# Patient Record
Sex: Male | Born: 2000 | Race: White | Hispanic: No | Marital: Single | Smoking: Never smoker
Health system: Southern US, Community
[De-identification: ages and names within clinical notes are randomized; demographics above are authoritative.]

## PROBLEM LIST (undated history)

## (undated) DIAGNOSIS — J45909 Unspecified asthma, uncomplicated: Secondary | ICD-10-CM

## (undated) DIAGNOSIS — F909 Attention-deficit hyperactivity disorder, unspecified type: Secondary | ICD-10-CM

---

## 2019-10-04 ENCOUNTER — Emergency Department (HOSPITAL_COMMUNITY)
Admission: EM | Admit: 2019-10-04 | Discharge: 2019-10-05 | Disposition: A | Discharge status: Home / Self Care | Payer: 59 | Attending: Emergency Medicine | Admitting: Emergency Medicine

## 2019-10-04 ENCOUNTER — Other Ambulatory Visit: Payer: Self-pay

## 2019-10-04 ENCOUNTER — Encounter (HOSPITAL_COMMUNITY): Payer: Self-pay | Admitting: Emergency Medicine

## 2019-10-04 ENCOUNTER — Emergency Department (HOSPITAL_COMMUNITY): Payer: 59

## 2019-10-04 DIAGNOSIS — R0789 Other chest pain: Secondary | ICD-10-CM | POA: Diagnosis not present

## 2019-10-04 DIAGNOSIS — R0602 Shortness of breath: Secondary | ICD-10-CM | POA: Insufficient documentation

## 2019-10-04 DIAGNOSIS — J45909 Unspecified asthma, uncomplicated: Secondary | ICD-10-CM | POA: Diagnosis not present

## 2019-10-04 HISTORY — DX: Unspecified asthma, uncomplicated: J45.909

## 2019-10-04 LAB — CBC
HCT: 45.7 % (ref 39.0–52.0)
Hemoglobin: 15.9 g/dL (ref 13.0–17.0)
MCH: 31.1 pg (ref 26.0–34.0)
MCHC: 34.8 g/dL (ref 30.0–36.0)
MCV: 89.4 fL (ref 80.0–100.0)
Platelets: 271 10*3/uL (ref 150–400)
RBC: 5.11 MIL/uL (ref 4.22–5.81)
RDW: 12.7 % (ref 11.5–15.5)
WBC: 10.4 10*3/uL (ref 4.0–10.5)
nRBC: 0 % (ref 0.0–0.2)

## 2019-10-04 LAB — BASIC METABOLIC PANEL
Anion gap: 12 (ref 5–15)
BUN: 10 mg/dL (ref 6–20)
CO2: 23 mmol/L (ref 22–32)
Calcium: 9.4 mg/dL (ref 8.9–10.3)
Chloride: 101 mmol/L (ref 98–111)
Creatinine, Ser: 0.98 mg/dL (ref 0.61–1.24)
GFR calc Af Amer: >60 mL/min (ref >60)
GFR calc non Af Amer: >60 mL/min (ref >60)
Glucose, Bld: 143 mg/dL — ABNORMAL HIGH (ref 70–99)
Potassium: 3.1 mmol/L — ABNORMAL LOW (ref 3.5–5.1)
Sodium: 136 mmol/L (ref 135–145)

## 2019-10-04 LAB — D-DIMER, QUANTITATIVE: D-Dimer, Quant: 0.28 ug/mL-FEU (ref 0.00–0.50)

## 2019-10-04 LAB — TROPONIN I (HIGH SENSITIVITY): Troponin I (High Sensitivity): <2 ng/L (ref <18)

## 2019-10-04 MED ORDER — HYDROCODONE-ACETAMINOPHEN 5-325 MG PO TABS: 1.0000 | ORAL_TABLET | Freq: Once | ORAL | Status: DC

## 2019-10-04 MED ORDER — SODIUM CHLORIDE 0.9% FLUSH
3.0000 mL | Freq: Once | INTRAVENOUS | Status: AC
  Administered 2019-10-04: 3 mL via INTRAVENOUS

## 2019-10-04 MED ORDER — POTASSIUM CHLORIDE CRYS ER 20 MEQ PO TBCR
40.0000 meq | EXTENDED_RELEASE_TABLET | Freq: Once | ORAL | Status: AC
  Administered 2019-10-04: 40 meq via ORAL
  Filled 2019-10-04: qty 2

## 2019-10-04 NOTE — ED Provider Notes (Signed)
[facility] EMERGENCY DEPARTMENT Provider Note   CSN: [id number] Arrival date & time: 10/04/19  2112     History Chief Complaint  Patient presents with  . Shortness of Breath  . Chest Pain    Larry Henderson is a 19 y.o. male.  Patient presents to the emergency department with a chief complaint of shortness of breath and chest pain.  He reports gradually worsening symptoms over the past week, but acutely worsened today.  He does have a history of asthma, and states that asthma got worse when he had Covid-19 in September he had recovered completely from this.  He reports family history of blood clots.  Reports that he recently had a 12-hour drive about 2 weeks ago.  Denies any calf pain or leg swelling.  He states that hurts more to take a deep breath, and he feels out of breath.  He also reports anterior chest wall soreness, but denies any injury.  Denies any fever, chills, cough.  Denies any treatments prior to arrival.  The history is provided by the patient. No language interpreter was used.       Past Medical History:  Diagnosis Date  . Asthma     There are no problems to display for this patient.   History reviewed. No pertinent surgical history.     No family history on file.  Social History   Tobacco Use  . Smoking status: Never Smoker  . Smokeless tobacco: Never Used  Substance Use Topics  . Alcohol use: Never  . Drug use: Never    Home Medications Prior to Admission medications   Not on File    Allergies    Patient has no known allergies.  Review of Systems   Review of Systems  All other systems reviewed and are negative.   Physical Exam Updated Vital Signs BP 124/90 (BP Location: Left Arm)   Pulse 80   Temp 98.4 F (36.9 C) (Oral)   Resp 18   SpO2 100%   Physical Exam Vitals and nursing note reviewed.  Constitutional:      Appearance: He is well-developed.  HENT:     Head: Normocephalic and atraumatic.  Eyes:      Conjunctiva/sclera: Conjunctivae normal.  Cardiovascular:     Rate and Rhythm: Normal rate and regular rhythm.     Heart sounds: No murmur.  Pulmonary:     Effort: Pulmonary effort is normal. No respiratory distress.     Breath sounds: Normal breath sounds.     Comments: Lungs are clear to auscultation bilaterally Moderate anterior chest wall tenderness Abdominal:     Palpations: Abdomen is soft.     Tenderness: There is no abdominal tenderness.  Musculoskeletal:     Cervical back: Neck supple.     Comments: No calf tenderness No leg swelling  Skin:    General: Skin is warm and dry.  Neurological:     Mental Status: He is alert and oriented to person, place, and time.  Psychiatric:        Mood and Affect: Mood normal.        Behavior: Behavior normal.     ED Results / Procedures / Treatments   Labs (all labs ordered are listed, but only abnormal results are displayed) Labs Reviewed  BASIC METABOLIC PANEL - Abnormal; Notable for the following components:      Result Value   Potassium 3.1 (*)    Glucose, Bld 143 (*)    All  other components within normal limits  CBC  D-DIMER, QUANTITATIVE (NOT AT [facility])  TROPONIN I (HIGH SENSITIVITY)    EKG None  Radiology DG Chest 2 View  Result Date: 10/04/2019 CLINICAL DATA:  Chest pain. EXAM: CHEST - 2 VIEW COMPARISON:  None. FINDINGS: There is no evidence of acute infiltrate, pleural effusion or pneumothorax. A tiny area of atelectasis versus focal scarring is seen within the mid right lung. The heart size and mediastinal contours are within normal limits. The visualized skeletal structures are unremarkable. IMPRESSION: No active cardiopulmonary disease. Electronically Signed   By: Aram Candela M.D.   On: 10/04/2019 21:49    Procedures Procedures (including critical care time)  Medications Ordered in ED Medications  sodium chloride flush (NS) 0.9 % injection 3 mL (has no administration in time range)  potassium  chloride SA (KLOR-CON) CR tablet 40 mEq (has no administration in time range)  HYDROcodone-acetaminophen (NORCO/VICODIN) 5-325 MG per tablet 1 tablet (has no administration in time range)    ED Course  I have reviewed the triage vital signs and the nursing notes.  Pertinent labs & imaging results that were available during my care of the patient were reviewed by me and considered in my medical decision making (see chart for details).    MDM Rules/Calculators/A&P                      Patient with chest pain and shortness of breath.  Vital signs are normal.  He is not hypoxic nor tachycardic.  No fever or cough.  Had Covid-19 back in September.  Chest x-ray is negative.  Laboratory work-up notable for hypokalemia of 3.1.  Will give supplemental K here.  Encouraged to increase dietary K.  Patient does report family history of blood clots, and did have a recent long car ride (12 hours).  Will check D-dimer.  Anticipate discharge.  D dimer is negative, no hypoxia nor tachycardia.  Doubt PE.  Low risk for ACS given age and lack of risk factors.  Easily reproducible with palpation.  Likely MSK.  Recommend continue outpatient workup.  No emergent etiology identified.  Final Clinical Impression(s) / ED Diagnoses Final diagnoses:  Shortness of breath  Chest wall pain    Rx / DC Orders ED Discharge Orders         Ordered    ibuprofen (ADVIL) 800 MG tablet  3 times daily     10/05/19 0000    albuterol (VENTOLIN HFA) 108 (90 Base) MCG/ACT inhaler  Every 4 hours PRN     10/05/19 0000           Roxy Horseman, PA-C 10/05/19 2542    Rolan Bucco, MD 10/06/19 1124

## 2019-10-04 NOTE — ED Triage Notes (Signed)
Pt c/o chest pain and shortness of breath x 1 week. Pt hx asthma. States he has struggled with shortness of breath since being diagnosed with COVID in September.

## 2019-10-04 NOTE — ED Notes (Addendum)
Pt ambulatory to Rm 3 from WR. Pt is A&Ox4, in NAD. VSS on monitors. Pt c/o sharp central CP and SOB x1 week. Pt reports it worsened today. Pt ambulatory in room w/o difficulty. Speaking in full sentences.

## 2019-10-04 NOTE — ED Notes (Signed)
PIV initiated, 20 G to RAC. IV flushes with 10 cc NS without s/s of infiltration. Secured with tape and tegaderm. Positive blood return noted. D Dimer drawn, labeled with 2 pt identifiers, and sent to lab

## 2019-10-04 NOTE — ED Notes (Signed)
Repeat troponin drawn, labeled with 2 pt identifiers, and sent to lab Potassium given per MAR. Name/DOB verified with pt Pt remains on cart in NAD. VSS on monitors. Call light within reach. Breathing easy, non-labored

## 2019-10-04 NOTE — ED Notes (Signed)
ED Provider at bedside. 

## 2019-10-04 NOTE — ED Notes (Signed)
Ahmaud Duthie mom [phone] aware pt just arrived, call with any questions/ updates

## 2019-10-05 LAB — TROPONIN I (HIGH SENSITIVITY): Troponin I (High Sensitivity): <2 ng/L (ref <18)

## 2019-10-05 MED ORDER — IBUPROFEN 800 MG PO TABS
800.0000 mg | ORAL_TABLET | Freq: Once | ORAL | Status: AC
  Administered 2019-10-05: 01:00:00 800 mg via ORAL
  Filled 2019-10-05: qty 1

## 2019-10-05 MED ORDER — ALBUTEROL SULFATE HFA 108 (90 BASE) MCG/ACT IN AERS: 2.0000 | INHALATION_SPRAY | RESPIRATORY_TRACT | 0 refills | Status: AC | PRN

## 2019-10-05 MED ORDER — IBUPROFEN 800 MG PO TABS: 800.0000 mg | ORAL_TABLET | Freq: Three times a day (TID) | ORAL | 0 refills | Status: AC

## 2019-10-05 MED ORDER — ALBUTEROL SULFATE HFA 108 (90 BASE) MCG/ACT IN AERS
1.0000 | INHALATION_SPRAY | RESPIRATORY_TRACT | Status: DC | PRN
  Administered 2019-10-05: 01:00:00 2 via RESPIRATORY_TRACT
  Filled 2019-10-05: qty 6.7

## 2019-10-05 NOTE — ED Notes (Signed)
Pt provided with phone to give family update

## 2019-10-05 NOTE — ED Notes (Signed)
All meds given per MAR. Name/DOB verified with pt 

## 2019-10-05 NOTE — Discharge Instructions (Signed)
You were evaluated for chest pain and shortness of breath tonight.  No emergent causes of your symptoms was found.    You workup today puts you at extremely low risk for pulmonary embolism (blood clot), heart attack, or infection.  Because your pain can be reproduced with pressing on your chest, it is thought to be musculoskeletal in nature, however, I recommend that you be seen by your primary care doctor in follow-up.    Return to the ER for new or worsening symptoms.

## 2019-10-05 NOTE — ED Notes (Addendum)
Patient verbalizes understanding of discharge instructions. Follow up and prescription medication information provided. Opportunity for questioning and answers were provided. All questions answered Armband removed by staff, pt discharged from ED. Ambulatory from ED with strong, steady gait.

## 2019-10-05 NOTE — ED Notes (Signed)
ED Provider at bedside. 

## 2019-10-05 NOTE — ED Notes (Signed)
Pt requesting dose of albuterol and ibuprofen before he leaves because he does not have a ride to a 24 hr pharmacy. PA updated

## 2019-10-16 ENCOUNTER — Other Ambulatory Visit: Payer: Self-pay

## 2019-10-16 ENCOUNTER — Emergency Department (HOSPITAL_BASED_OUTPATIENT_CLINIC_OR_DEPARTMENT_OTHER): Payer: 59

## 2019-10-16 ENCOUNTER — Encounter (HOSPITAL_BASED_OUTPATIENT_CLINIC_OR_DEPARTMENT_OTHER): Payer: Self-pay | Admitting: Emergency Medicine

## 2019-10-16 ENCOUNTER — Emergency Department (HOSPITAL_BASED_OUTPATIENT_CLINIC_OR_DEPARTMENT_OTHER)
Admission: EM | Admit: 2019-10-16 | Discharge: 2019-10-16 | Disposition: A | Discharge status: Home / Self Care | Payer: 59 | Attending: Emergency Medicine | Admitting: Emergency Medicine

## 2019-10-16 DIAGNOSIS — S91311A Laceration without foreign body, right foot, initial encounter: Secondary | ICD-10-CM | POA: Insufficient documentation

## 2019-10-16 DIAGNOSIS — J45909 Unspecified asthma, uncomplicated: Secondary | ICD-10-CM | POA: Diagnosis not present

## 2019-10-16 DIAGNOSIS — Y929 Unspecified place or not applicable: Secondary | ICD-10-CM | POA: Diagnosis not present

## 2019-10-16 DIAGNOSIS — Y939 Activity, unspecified: Secondary | ICD-10-CM | POA: Insufficient documentation

## 2019-10-16 DIAGNOSIS — Y999 Unspecified external cause status: Secondary | ICD-10-CM | POA: Insufficient documentation

## 2019-10-16 DIAGNOSIS — Z79899 Other long term (current) drug therapy: Secondary | ICD-10-CM | POA: Insufficient documentation

## 2019-10-16 DIAGNOSIS — W25XXXA Contact with sharp glass, initial encounter: Secondary | ICD-10-CM | POA: Insufficient documentation

## 2019-10-16 DIAGNOSIS — Z23 Encounter for immunization: Secondary | ICD-10-CM | POA: Insufficient documentation

## 2019-10-16 DIAGNOSIS — F909 Attention-deficit hyperactivity disorder, unspecified type: Secondary | ICD-10-CM | POA: Insufficient documentation

## 2019-10-16 HISTORY — DX: Attention-deficit hyperactivity disorder, unspecified type: F90.9

## 2019-10-16 MED ORDER — CEPHALEXIN 500 MG PO CAPS: 500.0000 mg | ORAL_CAPSULE | Freq: Four times a day (QID) | ORAL | 0 refills | Status: AC

## 2019-10-16 MED ORDER — LIDOCAINE-EPINEPHRINE (PF) 2 %-1:200000 IJ SOLN
10.0000 mL | Freq: Once | INTRAMUSCULAR | Status: AC
  Administered 2019-10-16: 10 mL
  Filled 2019-10-16: qty 10

## 2019-10-16 MED ORDER — CEPHALEXIN 250 MG PO CAPS
500.0000 mg | ORAL_CAPSULE | Freq: Once | ORAL | Status: AC
  Administered 2019-10-16: 500 mg via ORAL
  Filled 2019-10-16: qty 2

## 2019-10-16 MED ORDER — TETANUS-DIPHTH-ACELL PERTUSSIS 5-2.5-18.5 LF-MCG/0.5 IM SUSP
0.5000 mL | Freq: Once | INTRAMUSCULAR | Status: AC
  Administered 2019-10-16: 0.5 mL via INTRAMUSCULAR
  Filled 2019-10-16: qty 0.5

## 2019-10-16 NOTE — ED Triage Notes (Signed)
Pt reports cutting right foot on glass last night. Bandage applied pta. No bleeding apparent

## 2019-10-16 NOTE — ED Notes (Signed)
States he stepped on broken glass from a broken mirror, laceration at base of rt foot, no active bleeding noted, approx 3/4cm in length, some mild swelling and mild bruising noted.

## 2019-10-16 NOTE — ED Notes (Signed)
Pt states that it feels like glass still in foot.

## 2019-10-16 NOTE — ED Notes (Signed)
Wound dressed prior to discharge.

## 2019-10-16 NOTE — Discharge Instructions (Signed)
Please read and follow all provided instructions.  Your diagnoses today include:  1. Laceration of right foot, initial encounter     Tests performed today include:  X-ray of the affected area that did not show any foreign bodies or broken bones  Vital signs. See below for your results today.   Medications prescribed:   Keflex (cephalexin) - antibiotic  You have been prescribed an antibiotic medicine: take the entire course of medicine even if you are feeling better. Stopping early can cause the antibiotic not to work.  Take any prescribed medications only as directed.   Home care instructions:  Follow any educational materials and wound care instructions contained in this packet.   Keep affected area above the level of your heart when possible to minimize swelling. Wash area gently twice a day with warm soapy water. Do not apply alcohol or hydrogen peroxide. Cover the area if it draining or weeping.   Return instructions:  Return to the Emergency Department if you have:  Fever  Worsening pain  Worsening swelling of the wound  Pus draining from the wound  Redness of the skin that moves away from the wound, especially if it streaks away from the affected area   Any other emergent concerns  Your vital signs today were: BP 136/87 (BP Location: Right Arm)   Pulse 97   Temp 99.5 F (37.5 C) (Oral)   Resp 20   Ht 5\' 8"  (1.727 m)   Wt 59 kg   SpO2 100%   BMI 19.77 kg/m  If your blood pressure (BP) was elevated above 135/85 this visit, please have this repeated by your doctor within one month. --------------

## 2019-10-16 NOTE — ED Provider Notes (Signed)
[facility] EMERGENCY DEPARTMENT Provider Note   CSN: [id number] Arrival date & time: 10/16/19  1807     History Chief Complaint  Patient presents with  . Laceration    Larry Henderson is a 19 y.o. male.  Patient presents to the emergency department today with complaint of laceration to the bottom of his right foot.  Injury was sustained at approximately 1 or 2 AM today when he stepped on some glass from a broken mirror.  He states that he was wearing a sock when this occurred.  He cleaned the area.  He has had pain with bearing weight.  He felt a hard area near the wound which he was concerned was a retained foreign body.  Patient denies any active bleeding.  He denies any history of diabetes or immune compromise.  No treatments otherwise prior to arrival.  Last tetanus at age 56.        Past Medical History:  Diagnosis Date  . ADHD   . Asthma     There are no problems to display for this patient.   History reviewed. No pertinent surgical history.     History reviewed. No pertinent family history.  Social History   Tobacco Use  . Smoking status: Never Smoker  . Smokeless tobacco: Never Used  Substance Use Topics  . Alcohol use: Not Currently  . Drug use: Never    Home Medications Prior to Admission medications   Medication Sig Start Date End Date Taking? Authorizing Provider  amphetamine-dextroamphetamine (ADDERALL) 10 MG tablet Take by mouth. 04/22/19  Yes [provider]  lisdexamfetamine (VYVANSE) 20 MG capsule Take by mouth. 04/22/19  Yes [provider]  albuterol (VENTOLIN HFA) 108 (90 Base) MCG/ACT inhaler Inhale 2 puffs into the lungs every 4 (four) hours as needed for wheezing or shortness of breath. 10/04/19   Roxy Horseman, PA-C  cephALEXin (KEFLEX) 500 MG capsule Take 1 capsule (500 mg total) by mouth 4 (four) times daily. 10/16/19   Renne Crigler, PA-C  ibuprofen (ADVIL) 800 MG tablet Take 1 tablet (800 mg total) by  mouth 3 (three) times daily. 10/04/19   Roxy Horseman, PA-C    Allergies    Patient has no known allergies.  Review of Systems   Review of Systems  Constitutional: Negative for activity change.  Musculoskeletal: Positive for arthralgias and gait problem (Due to pain with weightbearing).  Skin: Positive for wound.  Neurological: Negative for weakness and numbness.    Physical Exam Updated Vital Signs BP (!) 148/90 (BP Location: Right Arm)   Pulse 78   Temp 99.5 F (37.5 C) (Oral)   Resp 19   Ht 5\' 8"  (1.727 m)   Wt 59 kg   SpO2 100%   BMI 19.77 kg/m   Physical Exam Vitals and nursing note reviewed.  Constitutional:      Appearance: He is well-developed.  HENT:     Head: Normocephalic and atraumatic.  Eyes:     Conjunctiva/sclera: Conjunctivae normal.  Cardiovascular:     Pulses: Normal pulses. No decreased pulses.  Musculoskeletal:        General: Tenderness present. No swelling.     Cervical back: Normal range of motion and neck supple.     Comments: There is an approximately 8 mm V-shaped laceration noted to the plantar aspect of the right midfoot.  No active bleeding.  There is scabbing over the wound.  After anesthesia, wound was explored and probed.  I used hemostats and  while the wound was not deep, I did extend laterally underneath the subcutaneous tissue approximately 1 to 2 cm.  I did not palpate any hardness of the skin which would be consistent with deep foreign body and I did not feel any foreign bodies with thorough probing of the wound.  This was repeated after irrigation.  Skin:    General: Skin is warm and dry.  Neurological:     Mental Status: He is alert.     Sensory: No sensory deficit.     Comments: Motor, sensation, and vascular distal to the injury is fully intact.      ED Results / Procedures / Treatments   Labs (all labs ordered are listed, but only abnormal results are displayed) Labs Reviewed - No data to  display  EKG None  Radiology DG Foot Complete Right  Result Date: 10/16/2019 CLINICAL DATA:  Puncture wound to bottom of foot.  Stepped on glass. EXAM: RIGHT FOOT COMPLETE - 3+ VIEW COMPARISON:  None. FINDINGS: There is no evidence of fracture or dislocation. There is no evidence of arthropathy or other focal bone abnormality. Soft tissues are unremarkable. No radiopaque foreign body. IMPRESSION: Negative. Electronically Signed   By: Rolm Baptise M.D.   On: 10/16/2019 18:42    Procedures Procedures (including critical care time)  Medications Ordered in ED Medications  Tdap (BOOSTRIX) injection 0.5 mL (0.5 mLs Intramuscular Given 10/16/19 1857)  lidocaine-EPINEPHrine (XYLOCAINE W/EPI) 2 %-1:200000 (PF) injection 10 mL (10 mLs Infiltration Given 10/16/19 1856)  cephALEXin (KEFLEX) capsule 500 mg (500 mg Oral Given 10/16/19 2004)    ED Course  I have reviewed the triage vital signs and the nursing notes.  Pertinent labs & imaging results that were available during my care of the patient were reviewed by me and considered in my medical decision making (see chart for details).  Patient seen and examined.  Tetanus update ordered.  X-ray ordered to evaluate for radiopaque foreign body and this was negative.  X-ray reviewed by myself as well.  Vital signs reviewed and are as follows: BP (!) 148/90 (BP Location: Right Arm)   Pulse 78   Temp 99.5 F (37.5 C) (Oral)   Resp 19   Ht 5\' 8"  (1.727 m)   Wt 59 kg   SpO2 100%   BMI 19.77 kg/m   I discussed wound exploration with patient.  He agrees to proceed.  I used approximately 4 cc of 2% lidocaine with epinephrine and infiltrated around the wound.  Once adequate anesthesia was achieved, I visualized the base of the wound as best as possible.  The wound extended 1 to 2 cm into the subcutaneous tissue towards the anterior aspect of the foot.  This was completely along the subcutaneous layer.  Wound was not deep.  I could not visualize or palpate  any foreign bodies externally and I could not palpate or feel any foreign bodies using hemostats.  Exploration was extensive.  I then irrigated the wound with 1000 cc of normal saline under pressure.  I then again explored the wound with hemostats without any change in exam.  No foreign bodies retrieved.  I had a discussion with the patient about wound closure and this is not advised given it is greater than 12 hours after injury on a extremity.  Furthermore, we discussed that I cannot entirely rule out retained foreign body.  We will start patient on a short course of Keflex, first dose given in the ED.  We discussed signs  symptoms of infection and need to return.  We discussed wound care at home.  Encourage patient to return to the emergency department with worsening pain, swelling, redness, warmth, pus draining from the wound or other concerns.     MDM Rules/Calculators/A&P                      Patient with laceration on the plantar aspect of his right foot due to glass.  No indication for retained foreign body on x-ray or after thorough exploration of the wound.  Wound was left open given time since injury.  Treatment and return instructions as above.   Final Clinical Impression(s) / ED Diagnoses Final diagnoses:  Laceration of right foot, initial encounter    Rx / DC Orders ED Discharge Orders         Ordered    cephALEXin (KEFLEX) 500 MG capsule  4 times daily     10/16/19 1959           Renne Crigler, Cordelia Poche 10/16/19 2211    Rolan Bucco, MD 10/16/19 2223

## 2021-03-18 IMAGING — DX DG FOOT COMPLETE 3+V*R*
3 series · 3 of 3 positions shown · non-contrast
Comparison: None.

CLINICAL DATA: Puncture wound to bottom of foot.  Stepped on glass.

EXAM:
RIGHT FOOT COMPLETE - 3+ VIEW

[foot ap]
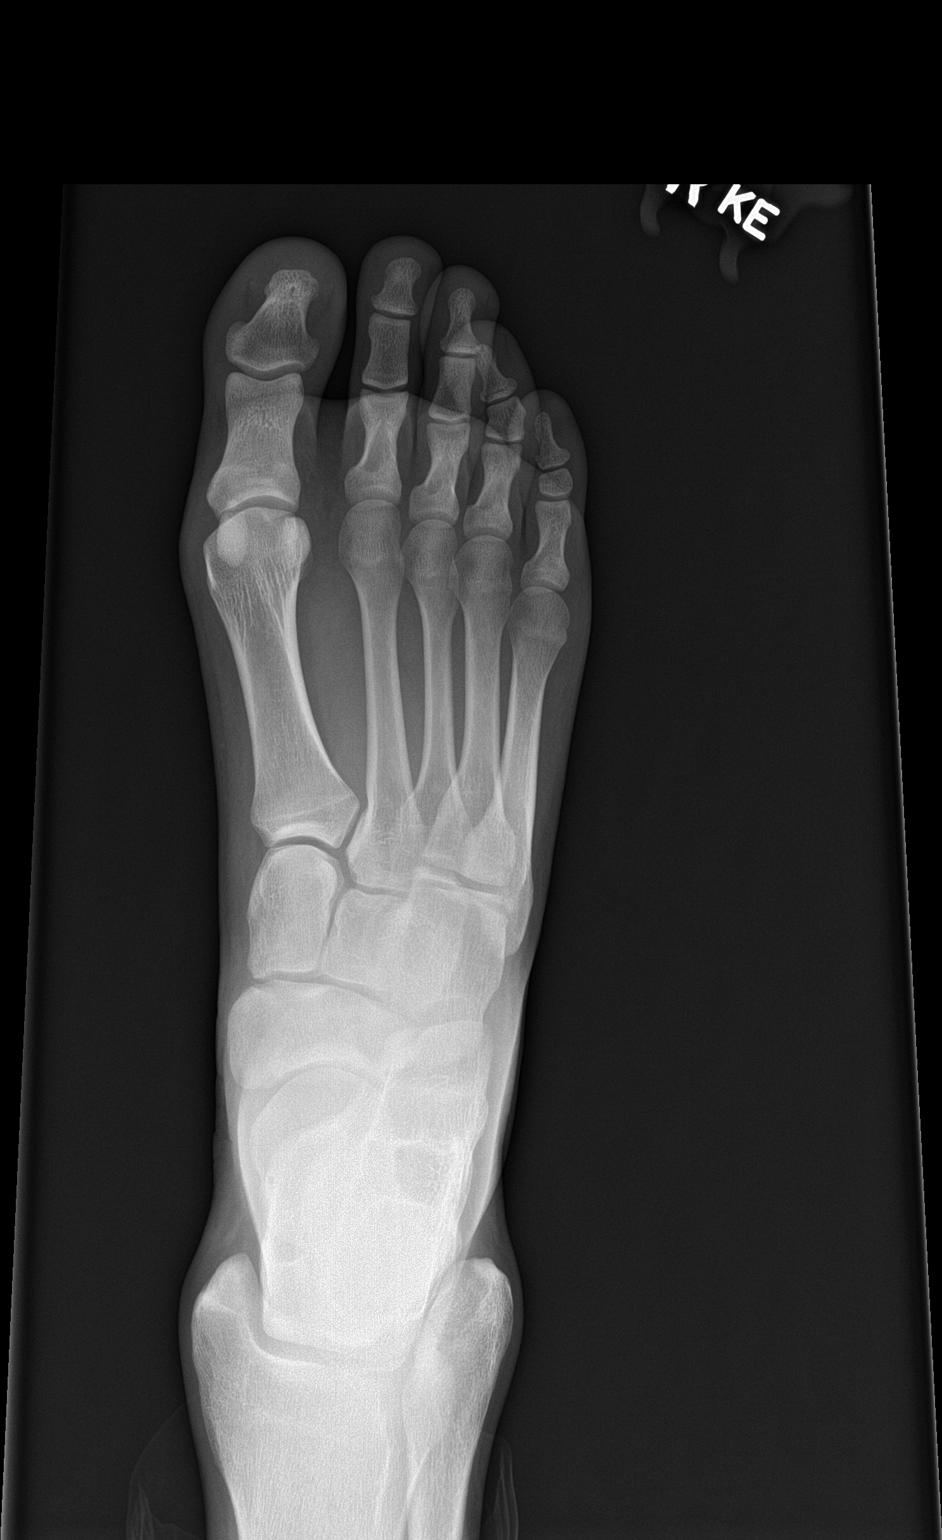

[foot obl]
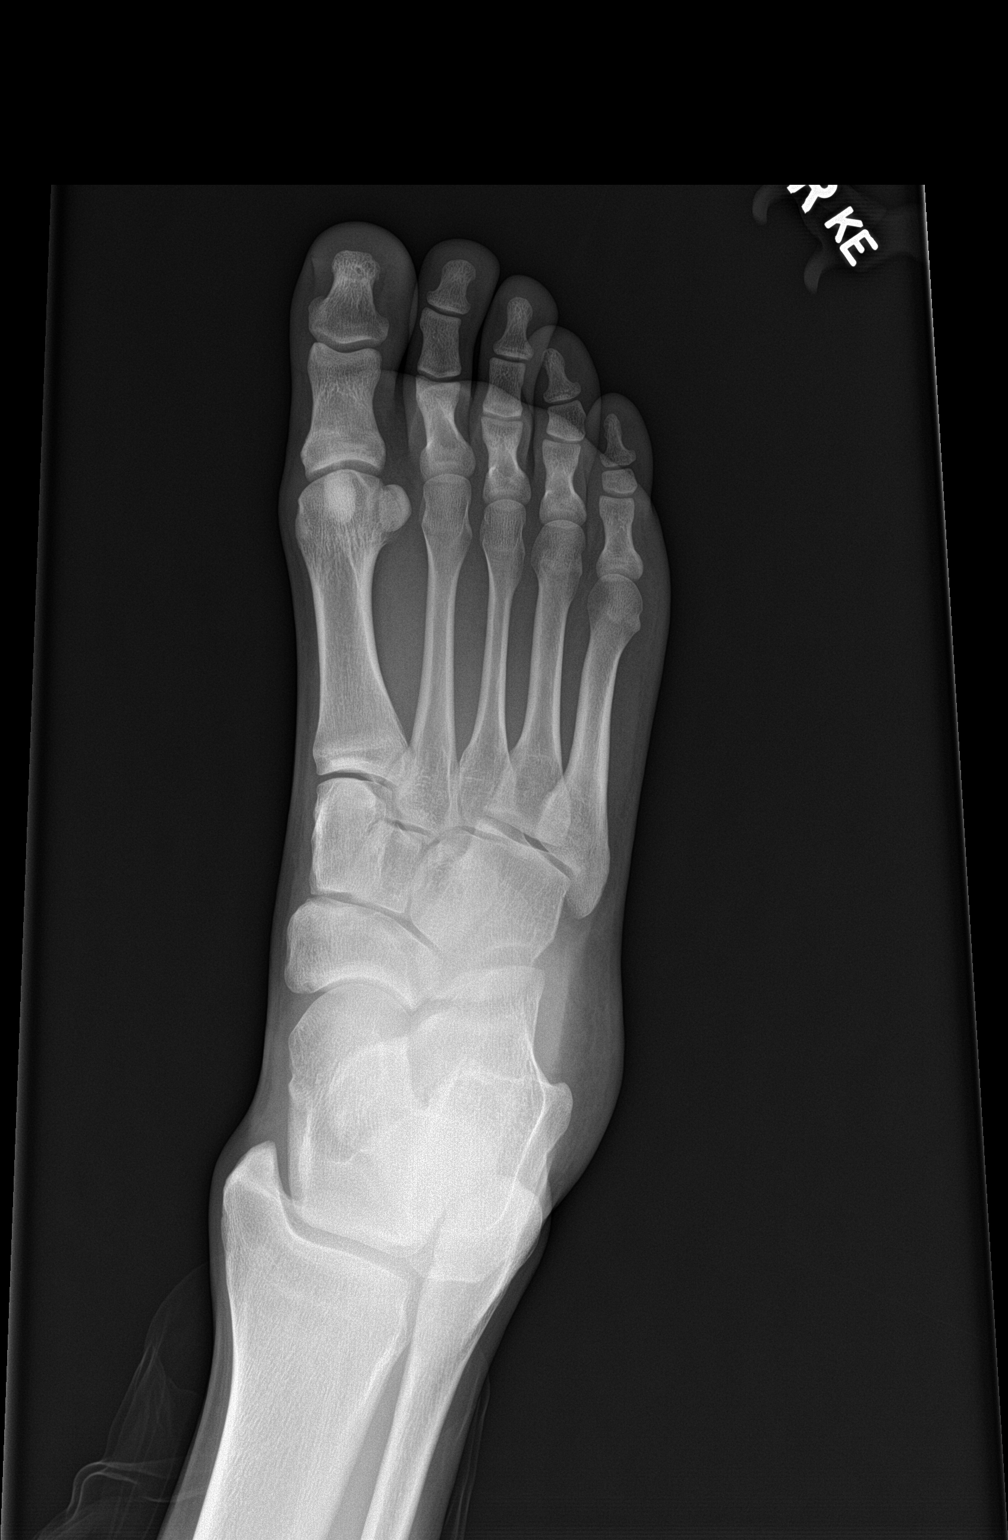

[foot lat]
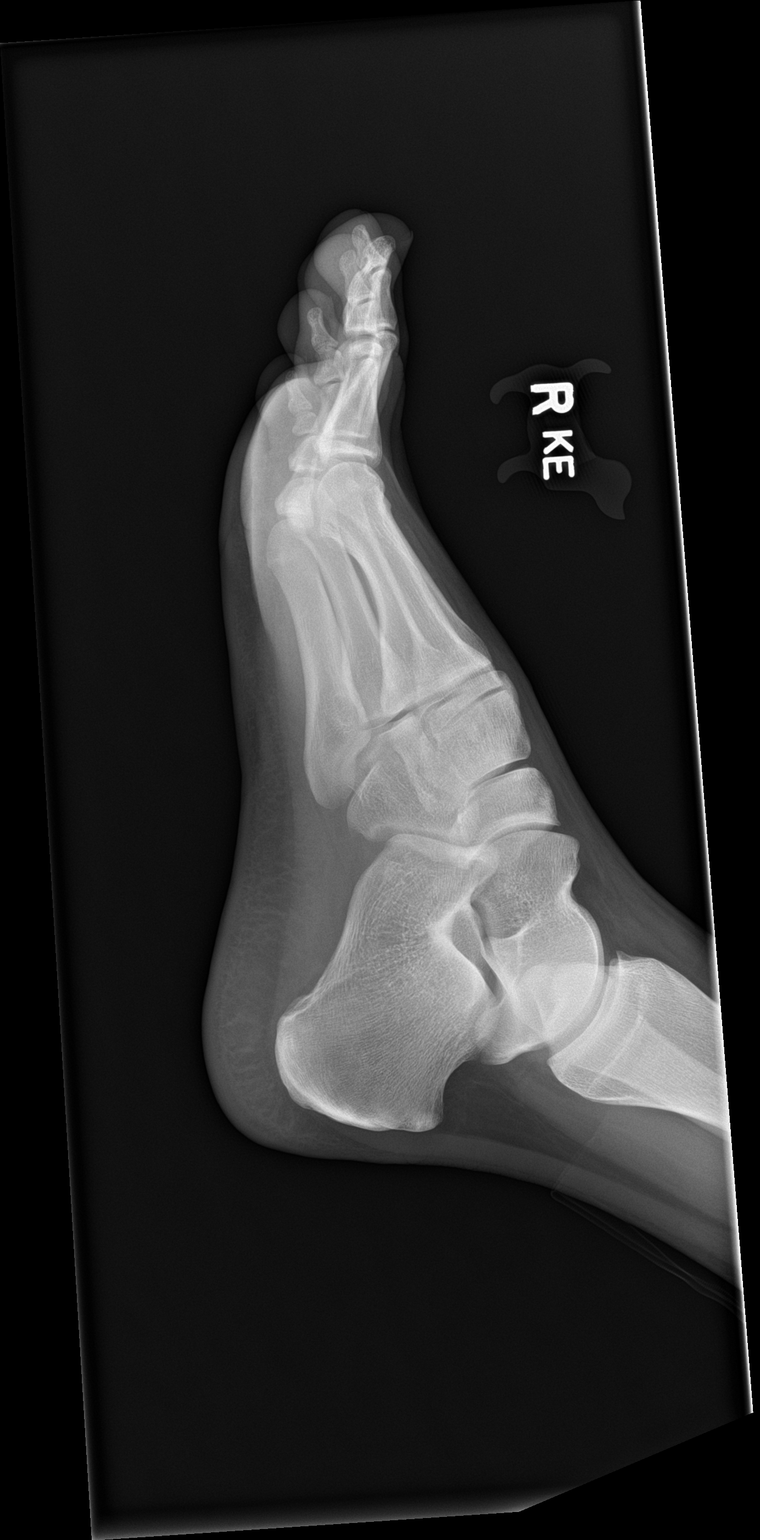

[3 of 3 positions shown; findings below may reference images not displayed]

FINDINGS: There is no evidence of fracture or dislocation. There is no
evidence of arthropathy or other focal bone abnormality. Soft
tissues are unremarkable. No radiopaque foreign body.
IMPRESSION: Negative.
